# Patient Record
Sex: Male | Born: 2000 | Race: White | Hispanic: No | Marital: Single | Smoking: Never smoker
Health system: Southern US, Community
[De-identification: ages and names within clinical notes are randomized; demographics above are authoritative.]

---
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
## 2021-05-07 ENCOUNTER — Other Ambulatory Visit: Payer: Self-pay

## 2021-05-07 ENCOUNTER — Encounter: Payer: Self-pay | Admitting: Podiatry

## 2021-05-07 ENCOUNTER — Ambulatory Visit (INDEPENDENT_AMBULATORY_CARE_PROVIDER_SITE_OTHER): Payer: Medicaid Other | Admitting: Podiatry

## 2021-05-07 DIAGNOSIS — M2140 Flat foot [pes planus] (acquired), unspecified foot: Secondary | ICD-10-CM | POA: Diagnosis not present

## 2021-05-07 NOTE — Progress Notes (Signed)
? ?  Subjective:  ?21 y.o. male presenting today as an established patient for evaluation of bilateral flatfoot deformity.  Patient states that about a month ago he began working at [employer] and he began having increased pain and tenderness to his feet.  About 1 year ago he did get custom molded orthotics at [facility] and they helped significantly.  He is requesting a new pair.  He presents for further treatment and evaluation ? ? ?No past medical history on file. ?No past surgical history on file. ?Allergies  ?Allergen Reactions  ? Shiitake Mushroom Nausea And Vomiting  ? ? ? ? ?  ?Objective/Physical Exam ?General: The patient is alert and oriented x3 in no acute distress. ? ?Dermatology: Skin is warm, dry and supple bilateral lower extremities. Negative for open lesions or macerations. ? ?Vascular: Palpable pedal pulses bilaterally. No edema or erythema noted. Capillary refill within normal limits. ? ?Neurological: Epicritic and protective threshold grossly intact bilaterally.  ? ?Musculoskeletal Exam: Range of motion within normal limits to all pedal and ankle joints bilateral. Muscle strength 5/5 in all groups bilateral.  ?Upon weightbearing there is a medial longitudinal arch collapse bilaterally. Remove foot valgus noted to the bilateral lower extremities with excessive pronation upon mid stance. ? ?Radiographic Exam:  ?Normal osseous mineralization. Joint spaces preserved. No fracture/dislocation/boney destruction.   ?Pes planus noted on radiographic exam lateral views. Decreased calcaneal inclination and metatarsal declination angle is noted. Anterior break in the cyma line noted on lateral views. Medial talar head to deviation noted on AP radiograph.  ? ?Assessment: ?1. pes planus bilateral ? ? ?Plan of Care:  ?1. Patient was evaluated. X-Rays reviewed.  ?2.  Prescription was provided for new custom molded orthotics to take to [facility] ?3.  Advised against going  barefoot ?4.  Return to clinic as needed ? ? ?Felecia Shelling, DPM ?[facility] ? ?Dr. Felecia Shelling, DPM  ?  ?[address]                                       ?[place], [place] [address]                ?Office [phone]  ?Fax [phone] ? ? ? ? ?
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
